# Patient Record
Sex: Female | Born: 1970 | Hispanic: Yes | Smoking: Never smoker
Health system: Southern US, Community
[De-identification: ages and names within clinical notes are randomized; demographics above are authoritative.]

## PROBLEM LIST (undated history)

## (undated) HISTORY — PX: BREAST BIOPSY: SHX20

---

## 2008-12-25 ENCOUNTER — Other Ambulatory Visit: Admission: RE | Admit: 2008-12-25 | Discharge: 2008-12-25 | Payer: Self-pay | Admitting: Family Medicine

## 2011-06-30 ENCOUNTER — Other Ambulatory Visit (HOSPITAL_COMMUNITY)
Admission: RE | Admit: 2011-06-30 | Discharge: 2011-06-30 | Disposition: A | Discharge status: Home / Self Care | Payer: BC Managed Care – PPO | Source: Ambulatory Visit | Attending: Family Medicine | Admitting: Family Medicine

## 2011-06-30 DIAGNOSIS — Z124 Encounter for screening for malignant neoplasm of cervix: Secondary | ICD-10-CM | POA: Insufficient documentation

## 2011-06-30 DIAGNOSIS — Z1159 Encounter for screening for other viral diseases: Secondary | ICD-10-CM | POA: Insufficient documentation

## 2013-12-06 ENCOUNTER — Other Ambulatory Visit: Payer: Self-pay

## 2013-12-06 DIAGNOSIS — Z1231 Encounter for screening mammogram for malignant neoplasm of breast: Secondary | ICD-10-CM

## 2013-12-26 ENCOUNTER — Ambulatory Visit
Admission: RE | Admit: 2013-12-26 | Discharge: 2013-12-26 | Disposition: A | Discharge status: Home / Self Care | Payer: No Typology Code available for payment source | Source: Ambulatory Visit

## 2013-12-26 DIAGNOSIS — Z1231 Encounter for screening mammogram for malignant neoplasm of breast: Secondary | ICD-10-CM

## 2014-12-12 ENCOUNTER — Other Ambulatory Visit: Payer: Self-pay

## 2014-12-12 DIAGNOSIS — Z1231 Encounter for screening mammogram for malignant neoplasm of breast: Secondary | ICD-10-CM

## 2015-01-16 ENCOUNTER — Other Ambulatory Visit: Payer: Self-pay | Admitting: Family Medicine

## 2015-01-16 ENCOUNTER — Other Ambulatory Visit (HOSPITAL_COMMUNITY)
Admission: RE | Admit: 2015-01-16 | Discharge: 2015-01-16 | Disposition: A | Discharge status: Home / Self Care | Payer: No Typology Code available for payment source | Source: Ambulatory Visit | Attending: Family Medicine | Admitting: Family Medicine

## 2015-01-16 DIAGNOSIS — Z01419 Encounter for gynecological examination (general) (routine) without abnormal findings: Secondary | ICD-10-CM | POA: Insufficient documentation

## 2015-01-17 LAB — CYTOLOGY - PAP

## 2015-01-18 ENCOUNTER — Ambulatory Visit
Admission: RE | Admit: 2015-01-18 | Discharge: 2015-01-18 | Disposition: A | Discharge status: Home / Self Care | Payer: No Typology Code available for payment source | Source: Ambulatory Visit

## 2015-01-18 DIAGNOSIS — Z1231 Encounter for screening mammogram for malignant neoplasm of breast: Secondary | ICD-10-CM

## 2015-03-08 ENCOUNTER — Emergency Department (INDEPENDENT_AMBULATORY_CARE_PROVIDER_SITE_OTHER)
Admission: EM | Admit: 2015-03-08 | Discharge: 2015-03-08 | Disposition: A | Discharge status: Home / Self Care | Payer: 59 | Source: Home / Self Care | Attending: Family Medicine | Admitting: Family Medicine

## 2015-03-08 ENCOUNTER — Encounter (HOSPITAL_COMMUNITY): Payer: Self-pay

## 2015-03-08 DIAGNOSIS — J069 Acute upper respiratory infection, unspecified: Secondary | ICD-10-CM | POA: Diagnosis not present

## 2015-03-08 MED ORDER — IPRATROPIUM BROMIDE 0.06 % NA SOLN: 2.0000 | Freq: Four times a day (QID) | NASAL | Status: DC

## 2015-03-08 NOTE — ED Provider Notes (Signed)
CSN: [id number]     Arrival date & time 03/08/15  1826 History   First MD Initiated Contact with Patient 03/08/15 1841     Chief Complaint  Patient presents with  . Cough   (Consider location/radiation/quality/duration/timing/severity/associated sxs/prior Treatment) Patient is a 44 y.o. female presenting with cough. The history is provided by the patient.  Cough Cough characteristics:  Dry, non-productive and harsh Severity:  Mild Onset quality:  Gradual Duration:  10 days Chronicity:  New Smoker: no   Context: sick contacts and upper respiratory infection   Ineffective treatments:  Steam and fluids Associated symptoms: rhinorrhea and sinus congestion   Associated symptoms: no chest pain, no chills, no fever, no sore throat and no wheezing     History reviewed. No pertinent past medical history. History reviewed. No pertinent past surgical history. No family history on file. Social History  Substance Use Topics  . Smoking status: None  . Smokeless tobacco: None  . Alcohol Use: None   OB History    No data available     Review of Systems  Constitutional: Negative.  Negative for fever and chills.  HENT: Positive for congestion, postnasal drip and rhinorrhea. Negative for sore throat.   Respiratory: Positive for cough. Negative for wheezing.   Cardiovascular: Negative.  Negative for chest pain.  All other systems reviewed and are negative.   Allergies  Review of patient's allergies indicates no known allergies.  Home Medications   Prior to Admission medications   Medication Sig Start Date End Date Taking? Authorizing Provider  ipratropium (ATROVENT) 0.06 % nasal spray Place 2 sprays into both nostrils 4 (four) times daily. 03/08/15   Linna HoffJames D Aydeen Blume, MD   Meds Ordered and Administered this Visit  Medications - No data to display  BP 129/77 mmHg  Pulse 76  Temp(Src) 97.4 F (36.3 C) (Oral)  Resp 17  SpO2 99%  LMP 02/26/2015 No data found.   Physical Exam   Constitutional: She is oriented to person, place, and time. She appears well-developed and well-nourished. No distress.  HENT:  Head: Normocephalic.  Right Ear: External ear normal.  Left Ear: External ear normal.  Nose: Mucosal edema and rhinorrhea present.  Mouth/Throat: Oropharynx is clear and moist.  Neck: Normal range of motion. Neck supple.  Cardiovascular: Normal heart sounds.   Pulmonary/Chest: Effort normal and breath sounds normal.  Lymphadenopathy:    She has no cervical adenopathy.  Neurological: She is alert and oriented to person, place, and time.  Skin: Skin is warm and dry.  Nursing note and vitals reviewed.   ED Course  Procedures (including critical care time)  Labs Review Labs Reviewed - No data to display  Imaging Review No results found.   Visual Acuity Review  Right Eye Distance:   Left Eye Distance:   Bilateral Distance:    Right Eye Near:   Left Eye Near:    Bilateral Near:         MDM   1. URI (upper respiratory infection)        Linna HoffJames D Ally Knodel, MD 03/08/15 [phone]

## 2015-03-08 NOTE — ED Notes (Signed)
Patient complains of sinus pressure. And cough that started about a week ago Patient stated she was seen in a mini clinic and told she had bronchitis and was given an  Inhaler and some cough medication but is still not any better

## 2015-03-08 NOTE — Discharge Instructions (Signed)
Drink plenty of fluids as discussed, use medicine as prescribed, and mucinex or delsym for cough. Return or see your doctor if further problems °

## 2016-09-10 ENCOUNTER — Other Ambulatory Visit: Payer: Self-pay | Admitting: Family Medicine

## 2016-09-10 DIAGNOSIS — Z1231 Encounter for screening mammogram for malignant neoplasm of breast: Secondary | ICD-10-CM

## 2016-09-18 ENCOUNTER — Ambulatory Visit
Admission: RE | Admit: 2016-09-18 | Discharge: 2016-09-18 | Disposition: A | Discharge status: Home / Self Care | Payer: BLUE CROSS/BLUE SHIELD | Source: Ambulatory Visit | Attending: Family Medicine | Admitting: Family Medicine

## 2016-09-18 DIAGNOSIS — Z1231 Encounter for screening mammogram for malignant neoplasm of breast: Secondary | ICD-10-CM

## 2016-09-19 ENCOUNTER — Other Ambulatory Visit: Payer: Self-pay | Admitting: Family Medicine

## 2016-09-19 DIAGNOSIS — R928 Other abnormal and inconclusive findings on diagnostic imaging of breast: Secondary | ICD-10-CM

## 2016-09-25 ENCOUNTER — Ambulatory Visit
Admission: RE | Admit: 2016-09-25 | Discharge: 2016-09-25 | Disposition: A | Discharge status: Home / Self Care | Payer: BLUE CROSS/BLUE SHIELD | Source: Ambulatory Visit | Attending: Family Medicine | Admitting: Family Medicine

## 2016-09-25 DIAGNOSIS — R928 Other abnormal and inconclusive findings on diagnostic imaging of breast: Secondary | ICD-10-CM

## 2017-09-28 ENCOUNTER — Other Ambulatory Visit: Payer: Self-pay | Admitting: Family Medicine

## 2017-09-28 DIAGNOSIS — Z1231 Encounter for screening mammogram for malignant neoplasm of breast: Secondary | ICD-10-CM

## 2017-09-29 ENCOUNTER — Ambulatory Visit
Admission: RE | Admit: 2017-09-29 | Discharge: 2017-09-29 | Disposition: A | Discharge status: Home / Self Care | Payer: BC Managed Care – PPO | Source: Ambulatory Visit | Attending: Family Medicine | Admitting: Family Medicine

## 2017-09-29 DIAGNOSIS — Z1231 Encounter for screening mammogram for malignant neoplasm of breast: Secondary | ICD-10-CM

## 2017-10-08 ENCOUNTER — Other Ambulatory Visit (HOSPITAL_COMMUNITY)
Admission: RE | Admit: 2017-10-08 | Discharge: 2017-10-08 | Disposition: A | Discharge status: Home / Self Care | Payer: BC Managed Care – PPO | Source: Ambulatory Visit | Attending: Family Medicine | Admitting: Family Medicine

## 2017-10-08 ENCOUNTER — Other Ambulatory Visit: Payer: Self-pay | Admitting: Family Medicine

## 2017-10-08 DIAGNOSIS — Z01411 Encounter for gynecological examination (general) (routine) with abnormal findings: Secondary | ICD-10-CM | POA: Insufficient documentation

## 2017-10-09 LAB — CYTOLOGY - PAP: DIAGNOSIS: NEGATIVE

## 2018-09-17 ENCOUNTER — Other Ambulatory Visit: Payer: Self-pay | Admitting: Family Medicine

## 2018-09-17 DIAGNOSIS — Z1231 Encounter for screening mammogram for malignant neoplasm of breast: Secondary | ICD-10-CM

## 2018-10-19 ENCOUNTER — Other Ambulatory Visit: Payer: Self-pay

## 2018-10-19 ENCOUNTER — Ambulatory Visit
Admission: RE | Admit: 2018-10-19 | Discharge: 2018-10-19 | Disposition: A | Discharge status: Home / Self Care | Payer: BC Managed Care – PPO | Source: Ambulatory Visit | Attending: Family Medicine | Admitting: Family Medicine

## 2018-10-19 DIAGNOSIS — Z1231 Encounter for screening mammogram for malignant neoplasm of breast: Secondary | ICD-10-CM

## 2018-10-20 ENCOUNTER — Other Ambulatory Visit: Payer: Self-pay | Admitting: Family Medicine

## 2018-10-20 DIAGNOSIS — Z1231 Encounter for screening mammogram for malignant neoplasm of breast: Secondary | ICD-10-CM

## 2018-12-01 ENCOUNTER — Encounter: Payer: Self-pay | Admitting: Family Medicine

## 2018-12-01 ENCOUNTER — Ambulatory Visit: Payer: Self-pay

## 2018-12-01 ENCOUNTER — Ambulatory Visit: Payer: BC Managed Care – PPO | Admitting: Family Medicine

## 2018-12-01 ENCOUNTER — Other Ambulatory Visit: Payer: Self-pay

## 2018-12-01 VITALS — BP 124/70 | Ht 62.0 in | Wt 135.0 lb

## 2018-12-01 DIAGNOSIS — M25521 Pain in right elbow: Secondary | ICD-10-CM

## 2018-12-01 DIAGNOSIS — M7711 Lateral epicondylitis, right elbow: Secondary | ICD-10-CM

## 2018-12-01 NOTE — Patient Instructions (Signed)
Your elbow pain is caused by lateral epicondylitis (tennis elbow) -We will give you a brace to wear.  You should use this during activities such as tennis - Do the exercises shown to you at today's visit most days of the week for the next 4 to 6 weeks - If your pain does not improve in the next 4 to 6 weeks you can come back in for follow-up

## 2018-12-01 NOTE — Progress Notes (Signed)
PCP: London Pepper, MD  Subjective:   HPI: Patient is a 48 y.o. female here for evaluation of right elbow pain.  The pain started approximately 6 months ago.  She denies any injury or trauma but she does play tennis and notes the pain gets aggravated by this.  Initially the pain is located on lateral aspect of her elbow but is now radiating down to her forearm.  She denies any associated numbness or tingling.  She has no bruising or swelling.  Patient notes the pain at the lateral aspect of her elbow as tolerated but since it has started radiating down to her forearm she decided come to see a physician for it.  She currently rates the pain 0 out of 10.  Review of Systems: See HPI above.  History reviewed. No pertinent past medical history.  No current outpatient medications on file prior to visit.   No current facility-administered medications on file prior to visit.     History reviewed. No pertinent surgical history.  No Known Allergies  Social History   Socioeconomic History  . Marital status: Unknown    Spouse name: Not on file  . Number of children: Not on file  . Years of education: Not on file  . Highest education level: Not on file  Occupational History  . Not on file  Social Needs  . Financial resource strain: Not on file  . Food insecurity    Worry: Not on file    Inability: Not on file  . Transportation needs    Medical: Not on file    Non-medical: Not on file  Tobacco Use  . Smoking status: Not on file  Substance and Sexual Activity  . Alcohol use: Not on file  . Drug use: Not on file  . Sexual activity: Not on file  Lifestyle  . Physical activity    Days per week: Not on file    Minutes per session: Not on file  . Stress: Not on file  Relationships  . Social Herbalist on phone: Not on file    Gets together: Not on file    Attends religious service: Not on file    Active member of club or organization: Not on file    Attends meetings of  clubs or organizations: Not on file    Relationship status: Not on file  . Intimate partner violence    Fear of current or ex partner: Not on file    Emotionally abused: Not on file    Physically abused: Not on file    Forced sexual activity: Not on file  Other Topics Concern  . Not on file  Social History Narrative  . Not on file    History reviewed. No pertinent family history.      Objective:  Physical Exam: BP 124/70   Ht 5\' 2"  (1.575 m)   Wt 135 lb (61.2 kg)   BMI 24.69 kg/m  Gen: NAD, comfortable in exam room Lungs: Breathing comfortably on room air Elbow Exam Right -Inspection: No discoloration, no deformity -Palpation: Minimal tenderness at common extensor origin. -ROM: Normal ROM with flexion, extension, pronation, supination -Strength: 5/5 strength with flexion, extension, pronation, supination.  Minimal pain with resisted wrist extension -Valgus stress: Negative -Limb neurovascularly intact  Contralateral Elbow -Inspection: No discoloration, no deformity -Palpation: No tenderness to palpation -ROM: Normal ROM with flexion, extension, pronation, supination -Strength: 5/5 strength with flexion, extension, pronation, supination -Limb neurovascularly intact  Limited diagnostic ultrasound  of right elbow Findings: - Intact common extensor tendon -Normal-appearing radial collateral ligament Impression: - Normal-appearing ultrasound of the lateral elbow    Assessment & Plan:  Patient is a 48 y.o. female here for evaluation of right elbow pain  1.  Right lateral epicondylitis - Patient given brace to wear with activities such as tennis - Patient given home exercises to work on -Patient may take Tylenol or ibuprofen as needed for pain  Follow-up in 4 to 6 weeks as needed

## 2018-12-02 ENCOUNTER — Encounter: Payer: Self-pay | Admitting: Family Medicine

## 2019-01-12 ENCOUNTER — Ambulatory Visit: Payer: BC Managed Care – PPO | Admitting: Sports Medicine

## 2019-10-31 ENCOUNTER — Other Ambulatory Visit: Payer: Self-pay

## 2019-10-31 ENCOUNTER — Ambulatory Visit
Admission: RE | Admit: 2019-10-31 | Discharge: 2019-10-31 | Disposition: A | Discharge status: Home / Self Care | Payer: BC Managed Care – PPO | Source: Ambulatory Visit | Attending: Family Medicine | Admitting: Family Medicine

## 2019-10-31 DIAGNOSIS — Z1231 Encounter for screening mammogram for malignant neoplasm of breast: Secondary | ICD-10-CM

## 2020-09-26 ENCOUNTER — Other Ambulatory Visit: Payer: Self-pay | Admitting: Family Medicine

## 2020-09-26 DIAGNOSIS — Z1231 Encounter for screening mammogram for malignant neoplasm of breast: Secondary | ICD-10-CM

## 2020-11-12 ENCOUNTER — Other Ambulatory Visit: Payer: Self-pay

## 2020-11-12 ENCOUNTER — Ambulatory Visit
Admission: RE | Admit: 2020-11-12 | Discharge: 2020-11-12 | Disposition: A | Discharge status: Home / Self Care | Payer: Self-pay | Source: Ambulatory Visit | Attending: Family Medicine | Admitting: Family Medicine

## 2020-11-12 DIAGNOSIS — Z1231 Encounter for screening mammogram for malignant neoplasm of breast: Secondary | ICD-10-CM

## 2021-02-05 ENCOUNTER — Other Ambulatory Visit (HOSPITAL_COMMUNITY)
Admission: RE | Admit: 2021-02-05 | Discharge: 2021-02-05 | Disposition: A | Discharge status: Home / Self Care | Payer: BC Managed Care – PPO | Source: Ambulatory Visit | Attending: Family Medicine | Admitting: Family Medicine

## 2021-02-05 DIAGNOSIS — Z01411 Encounter for gynecological examination (general) (routine) with abnormal findings: Secondary | ICD-10-CM | POA: Insufficient documentation

## 2021-02-07 LAB — CYTOLOGY - PAP
Comment: NEGATIVE
Diagnosis: NEGATIVE
High risk HPV: NEGATIVE

## 2021-07-31 ENCOUNTER — Ambulatory Visit
Admission: RE | Admit: 2021-07-31 | Discharge: 2021-07-31 | Disposition: A | Discharge status: Home / Self Care | Payer: BC Managed Care – PPO | Source: Ambulatory Visit | Attending: Family Medicine | Admitting: Family Medicine

## 2021-07-31 ENCOUNTER — Other Ambulatory Visit: Payer: Self-pay | Admitting: Family Medicine

## 2021-07-31 DIAGNOSIS — R0789 Other chest pain: Secondary | ICD-10-CM

## 2021-08-01 ENCOUNTER — Telehealth: Payer: Self-pay

## 2021-08-01 NOTE — Telephone Encounter (Signed)
NOTES SCANNED TO REFERRAL 

## 2021-08-08 ENCOUNTER — Encounter: Payer: Self-pay | Admitting: Cardiovascular Disease

## 2021-08-08 ENCOUNTER — Ambulatory Visit (INDEPENDENT_AMBULATORY_CARE_PROVIDER_SITE_OTHER): Payer: BC Managed Care – PPO | Admitting: Cardiovascular Disease

## 2021-08-08 VITALS — BP 120/70 | HR 71 | Ht 62.0 in | Wt 138.0 lb

## 2021-08-08 DIAGNOSIS — R072 Precordial pain: Secondary | ICD-10-CM

## 2021-08-08 DIAGNOSIS — E785 Hyperlipidemia, unspecified: Secondary | ICD-10-CM | POA: Insufficient documentation

## 2021-08-08 MED ORDER — METOPROLOL TARTRATE 50 MG PO TABS: ORAL_TABLET | ORAL | 0 refills | Status: AC

## 2021-08-08 NOTE — Patient Instructions (Signed)
Medication Instructions:  ?No changes ?*If you need a refill on your cardiac medications before your next appointment, please call your pharmacy* ? ? ?Lab Work: ?Labs completed 5/3 at PCP  ?If you have labs (blood work) drawn today and your tests are completely normal, you will receive your results only by: ?MyChart Message (if you have MyChart) OR ?A paper copy in the mail ?If you have any lab test that is abnormal or we need to change your treatment, we will call you to review the results. ? ? ?Follow-Up: ?At [facility], you and your health needs are our priority.  As part of our continuing mission to provide you with exceptional heart care, we have created designated Provider Care Teams.  These Care Teams include your primary Cardiologist (physician) and Advanced Practice Providers (APPs -  Physician Assistants and Nurse Practitioners) who all work together to provide you with the care you need, when you need it. ? ?We recommend signing up for the patient portal called "MyChart".  Sign up information is provided on this After Visit Summary.  MyChart is used to connect with patients for Virtual Visits (Telemedicine).  Patients are able to view lab/test results, encounter notes, upcoming appointments, etc.  Non-urgent messages can be sent to your provider as well.   ?To learn more about what you can do with MyChart, go to [email or web address].   ? ?Your next appointment:   ?Follow up as needed with Dr. Royann Shivers ? ? ?Other Instructions ? ? ?Your cardiac CT will be scheduled at one of the below locations:  ? ?[facility] ?[address] ?[place], [place] [address] ?[phone] ? ?OR ? ?[facility] ?[address] ?[address] ?[place], [place] [address] ?[phone] ? ?If scheduled at [facility], please arrive at the [facility] and Children's Entrance (Entrance C2) of [facility] 30 minutes prior to test start time. ?You can use the FREE valet  parking offered at entrance C (encouraged to control the heart rate for the test)  ?Proceed to the [facility] Radiology Department (first floor) to check-in and test prep. ? ?All radiology patients and guests should use entrance C2 at [facility], accessed from [facility], even though the hospital's physical address listed is [address]. ? ? ? ?If scheduled at [facility], please arrive 15 mins early for check-in and test prep. ? ?Please follow these instructions carefully (unless otherwise directed): ? ?On the Night Before the Test: ?Be sure to Drink plenty of water. ?Do not consume any caffeinated/decaffeinated beverages or chocolate 12 hours prior to your test. ?Do not take any antihistamines 12 hours prior to your test. ? ?On the Day of the Test: ?Drink plenty of water until 1 hour prior to the test. ?Do not eat any food 4 hours prior to the test. ?You may take your regular medications prior to the test.  ?Take metoprolol (Lopressor) two hours prior to test. ?FEMALES- please wear underwire-free bra if available, avoid dresses & tight clothing ?     ?After the Test: ?Drink plenty of water. ?After receiving IV contrast, you may experience a mild flushed feeling. This is normal. ?On occasion, you may experience a mild rash up to 24 hours after the test. This is not dangerous. If this occurs, you can take Benadryl 25 mg and increase your fluid intake. ?If you experience trouble breathing, this can be serious. If it is severe call 911 IMMEDIATELY. If it  is mild, please call our office. ?If you take any of these medications: Glipizide/Metformin, Avandament, Glucavance, please do not take 48 hours after completing test unless otherwise instructed. ? ?We will call to schedule your test 2-4 weeks out understanding that some insurance companies will need an authorization prior to the service being performed.  ? ?For non-scheduling related questions, please contact  the cardiac imaging nurse navigator should you have any questions/concerns: ?Rockwell Alexandria, Cardiac Imaging Nurse Navigator ?Larey Brick, Cardiac Imaging Nurse Navigator ? Heart and Vascular Services ?Direct Office Dial: [phone]  ? ?For scheduling needs, including cancellations and rescheduling, please call [place], [phone]. ? ? ? ? ?

## 2021-08-08 NOTE — Progress Notes (Signed)
?Cardiology Office Note:   ? ?Date:  08/08/2021  ? ?ID:  Janice Rich, DOB [birth year], MRN [id number] ? ?PCP:  Farris Has, MD ?  ?[facility] Providers ?Cardiologist:  None    ? ?Referring MD: Farris Has, MD  ? ?Chief Complaint  ?Patient presents with  ? Consult  ?Janice Rich is a 51 y.o. female who is being seen today for the evaluation of chest tightness at the request of Farris Has, MD. ? ?History of Present Illness:   ? ?Janice Rich is a 51 y.o. female with a hx of mild hypercholesterolemia, otherwise excellent health, who has recently developed chest pressure.  This usually occurs with activity.  She initially noticed that when she was walking the dog.  It improved after resting for a few minutes.  Another time she noticed that while she was dancing during labs in celebration and [address] , again resolving with activity.  She has had 1 or 2 events where she was awoke in the middle of the night with chest tightness.  This has been less frequent.  Each time her symptoms usually resolve within 2 or 3 minutes.  She is currently asymptomatic. ? ?Does not have exertional dyspnea, orthopnea, PND or lower extremity edema.  Denies claudication or focal neurological complaints.  Has a few musculoskeletal joint issues.  Denies palpitations, dizziness or syncope.  Enjoys playing tennis, but has avoided this since the onset of her chest tightness. ? ?She has never smoked.  She does not have hypertension or diabetes mellitus.  She went through menopause still symptoms about 2 or 3 years ago.  He is unable to report family history since she was adopted.  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  Patient ? ?History reviewed. No pertinent past medical history. ? ?History reviewed. No pertinent surgical history. ? ?Current Medications: ?Current Meds  ?Medication Sig  ? cetirizine (ZYRTEC) 10 MG tablet  Take 10 mg by mouth daily in the afternoon.  ? fluticasone (FLONASE) 50 MCG/ACT nasal spray Place into both nostrils daily in the afternoon.  ? metoprolol tartrate (LOPRESSOR) 50 MG tablet Take one tablet 2 hours before the test  ? naproxen sodium (ALEVE) 220 MG tablet 1 tablet as needed  ?  ? ?Allergies:   Patient has no known allergies.  ? ?Social History  ? ?Socioeconomic History  ? Marital status: Unknown  ?  Spouse name: Not on file  ? Number of children: Not on file  ? Years of education: Not on file  ? Highest education level: Not on file  ?Occupational History  ? Not on file  ?Tobacco Use  ? Smoking status: Never  ?  Passive exposure: Past  ? Smokeless tobacco: Never  ?Substance and Sexual Activity  ? Alcohol use: Not on file  ? Drug use: Not on file  ? Sexual activity: Not on file  ?Other Topics Concern  ? Not on file  ?Social History Narrative  ? Not on file  ? ?Social Determinants of Health  ? ?Financial Resource Strain: Not on file  ?Food Insecurity: Not on file  ?Transportation Needs: Not on file  ?Physical Activity: Not on file  ?Stress: Not on file  ?Social Connections: Not on file  ?  ? ?Family History: ?The patient's family history is unknown since she is adopted ? ?ROS:   ?Please see the history of present illness.    ?  All other systems reviewed and are negative. ? ?EKGs/Labs/Other Studies Reviewed:   ? ?The following studies were reviewed today: ?Notes from primary care provider. ? ?EKG:  EKG is ordered today.  The ekg ordered today demonstrates normal sinus rhythm, normal tracing.  QTc 417 ms ? ?Recent Labs: ?No results found for requested labs within last 8760 hours.  ?Recent Lipid Panel ?No results found for: CHOL, TRIG, HDL, CHOLHDL, VLDL, LDLCALC, LDLDIRECT ?02/05/2021 ?Cholesterol 218, HDL 71, LDL 135, triglycerides 69 ?07/31/2021 ?Hemoglobin 12.5, creatinine 0.83, potassium 4.6, ALT 20, TSH 1.53 ? ?Risk Assessment/Calculations:   ?  ? ?    ? ?Physical Exam:   ? ?VS:  BP 120/70 (BP  Location: Left Arm, Patient Position: Sitting, Cuff Size: Normal)   Pulse 71   Ht 5\' 2"  (1.575 m)   Wt 138 lb (62.6 kg)   SpO2 98%   BMI 25.24 kg/m?    ? ?Wt Readings from Last 3 Encounters:  ?08/08/21 138 lb (62.6 kg)  ?12/01/18 135 lb (61.2 kg)  ?  ? ?GEN: Appears lean and fit, well nourished, well developed in no acute distress ?HEENT: Normal ?NECK: No JVD; No carotid bruits ?LYMPHATICS: No lymphadenopathy ?CARDIAC: RRR, no murmurs, rubs, gallops ?RESPIRATORY:  Clear to auscultation without rales, wheezing or rhonchi  ?ABDOMEN: Soft, non-tender, non-distended ?MUSCULOSKELETAL:  No edema; No deformity  ?SKIN: Warm and dry ?NEUROLOGIC:  Alert and oriented x 3 ?PSYCHIATRIC:  Normal affect  ? ?ASSESSMENT:   ? ?1. Precordial pain   ? ?PLAN:   ? ?In order of problems listed above: ? ?Chest discomfort: Symptoms are mostly exertional and compatible with stable angina pectoris.  Suggested that she take aspirin 81 mg daily until clarify the diagnosis.  We will get a coronary CT angiogram.  Discussed the difference in stable and unstable angina symptoms and when she should seek urgent attention. ?HLP: Her LDL cholesterol is mildly elevated, but she also has an excellent HDL cholesterol.  Coronary calcium score will help 01/31/19 decide whether she needs more aggressive lipid-lowering. ? ?   ? ?   ? ? ?Medication Adjustments/Labs and Tests Ordered: ?Current medicines are reviewed at length with the patient today.  Concerns regarding medicines are outlined above.  ?Orders Placed This Encounter  ?Procedures  ? CT CORONARY MORPH W/CTA COR W/SCORE W/CA W/CM &/OR WO/CM  ? EKG 12-Lead  ? ?Meds ordered this encounter  ?Medications  ? metoprolol tartrate (LOPRESSOR) 50 MG tablet  ?  Sig: Take one tablet 2 hours before the test  ?  Dispense:  1 tablet  ?  Refill:  0  ? ? ?Patient Instructions  ?Medication Instructions:  ?No changes ?*If you need a refill on your cardiac medications before your next appointment, please call your  pharmacy* ? ? ?Lab Work: ?Labs completed 5/3 at PCP  ?If you have labs (blood work) drawn today and your tests are completely normal, you will receive your results only by: ?MyChart Message (if you have MyChart) OR ?A paper copy in the mail ?If you have any lab test that is abnormal or we need to change your treatment, we will call you to review the results. ? ? ?Follow-Up: ?At [facility], you and your health needs are our priority.  As part of our continuing mission to provide you with exceptional heart care, we have created designated Provider Care Teams.  These Care Teams include your primary Cardiologist (physician) and Advanced Practice Providers (APPs -  Physician Assistants and Nurse Practitioners) who  all work together to provide you with the care you need, when you need it. ? ?We recommend signing up for the patient portal called "MyChart".  Sign up information is provided on this After Visit Summary.  MyChart is used to connect with patients for Virtual Visits (Telemedicine).  Patients are able to view lab/test results, encounter notes, upcoming appointments, etc.  Non-urgent messages can be sent to your provider as well.   ?To learn more about what you can do with MyChart, go to [email or web address].   ? ?Your next appointment:   ?Follow up as needed with Dr. Royann Shiversroitoru ? ? ?Other Instructions ? ? ?Your cardiac CT will be scheduled at one of the below locations:  ? ?[facility] ?[address] ?[place], [place] [address] ?[phone] ? ?OR ? ?[facility] ?[address] ?[address] ?[place], [place] [address] ?[phone] ? ?If scheduled at [facility], please arrive at the [facility] (Entrance C2) of [facility] 30 minutes prior to test start time. ?You can use the FREE valet parking offered at entrance C (encouraged to control the heart rate for the test)  ?Proceed to the [facility] Radiology Department (first  floor) to check-in and test prep. ? ?All radiology patients and guests should use entrance C2 at [facility], accessed from [facility], even though the hospital's physical address listed is [address]

## 2021-09-04 ENCOUNTER — Telehealth (HOSPITAL_COMMUNITY): Payer: Self-pay | Admitting: *Deleted

## 2021-09-04 NOTE — Telephone Encounter (Signed)
Attempted to call patient regarding upcoming cardiac CT appointment. °Left message on voicemail with name and callback number ° °Aya Geisel RN Navigator Cardiac Imaging °[facility] °[phone] Office °[phone] Cell ° °

## 2021-09-05 ENCOUNTER — Ambulatory Visit (HOSPITAL_COMMUNITY)
Admission: RE | Admit: 2021-09-05 | Discharge: 2021-09-05 | Disposition: A | Discharge status: Home / Self Care | Payer: BC Managed Care – PPO | Source: Ambulatory Visit | Attending: Cardiovascular Disease | Admitting: Cardiovascular Disease

## 2021-09-05 DIAGNOSIS — R072 Precordial pain: Secondary | ICD-10-CM | POA: Insufficient documentation

## 2021-09-05 MED ORDER — IOHEXOL 350 MG/ML SOLN
100.0000 mL | Freq: Once | INTRAVENOUS | Status: AC | PRN
  Administered 2021-09-05: 100 mL via INTRAVENOUS

## 2021-09-05 MED ORDER — NITROGLYCERIN 0.4 MG SL SUBL
0.8000 mg | SUBLINGUAL_TABLET | Freq: Once | SUBLINGUAL | Status: AC
  Administered 2021-09-05: 0.8 mg via SUBLINGUAL

## 2021-09-05 MED ORDER — NITROGLYCERIN 0.4 MG SL SUBL
SUBLINGUAL_TABLET | SUBLINGUAL | Status: AC
  Filled 2021-09-05: qty 2

## 2022-02-03 IMAGING — MG MM DIGITAL SCREENING BILAT W/ TOMO AND CAD
8 series · 9 of 24 positions shown · non-contrast
Comparison: Previous exam(s).

CLINICAL DATA: Screening.

EXAM:
DIGITAL SCREENING BILATERAL MAMMOGRAM WITH TOMOSYNTHESIS AND CAD
TECHNIQUE: Bilateral screening digital craniocaudal and mediolateral oblique
mammograms were obtained. Bilateral screening digital breast
tomosynthesis was performed. The images were evaluated with
computer-aided detection.

[R MLO synth-2D]
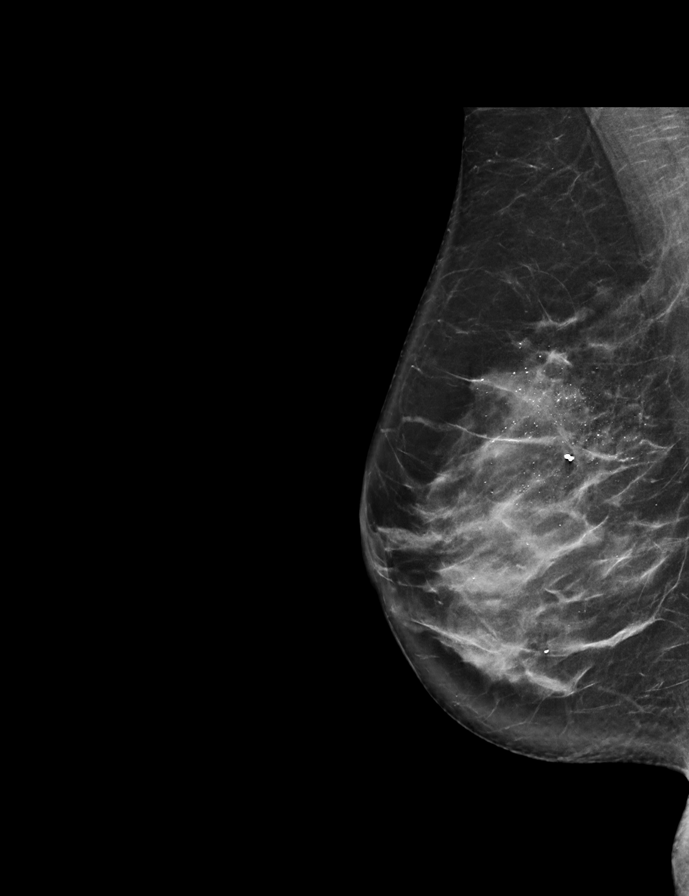

[L MLO synth-2D]
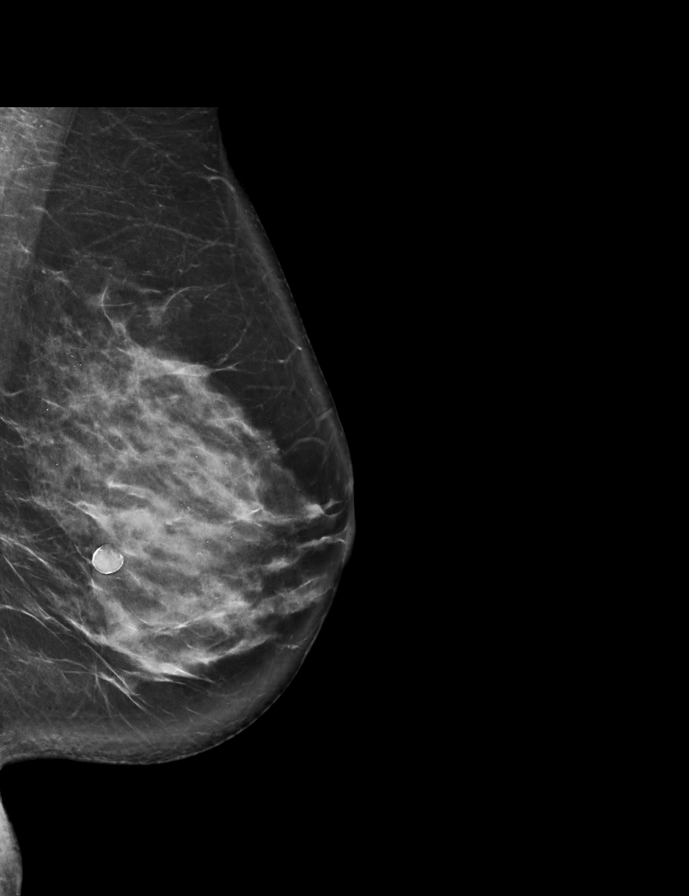

[R CC synth-2D]
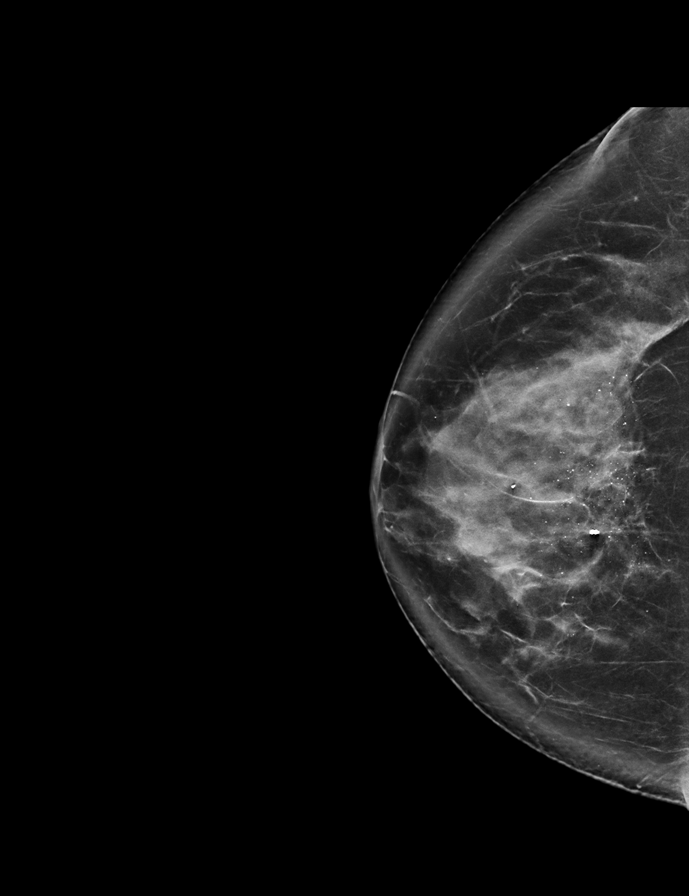

[L CC synth-2D]
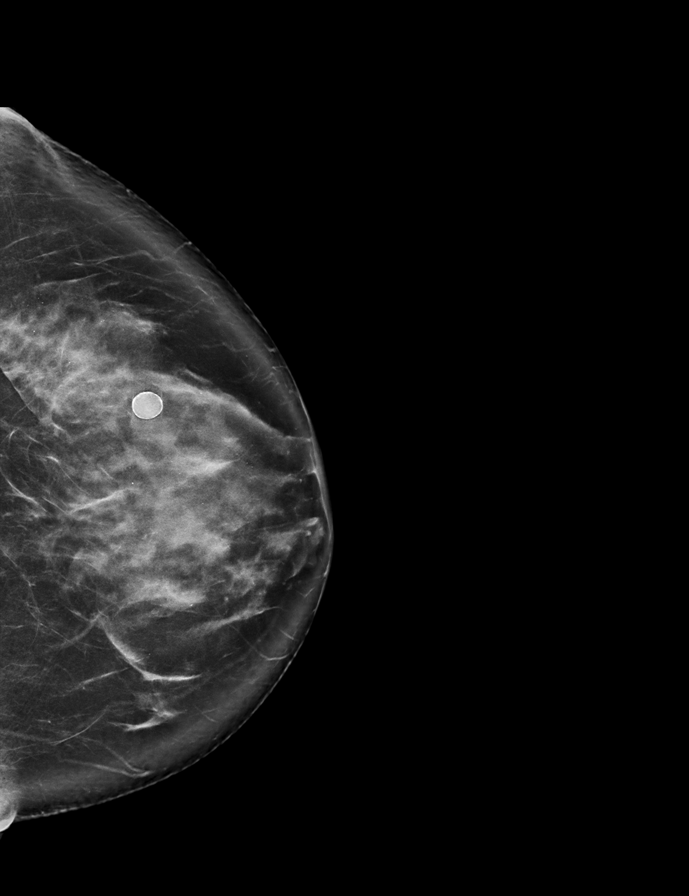

[R MLO tomo · 2 of 82 frames shown]
[frame 27/82]
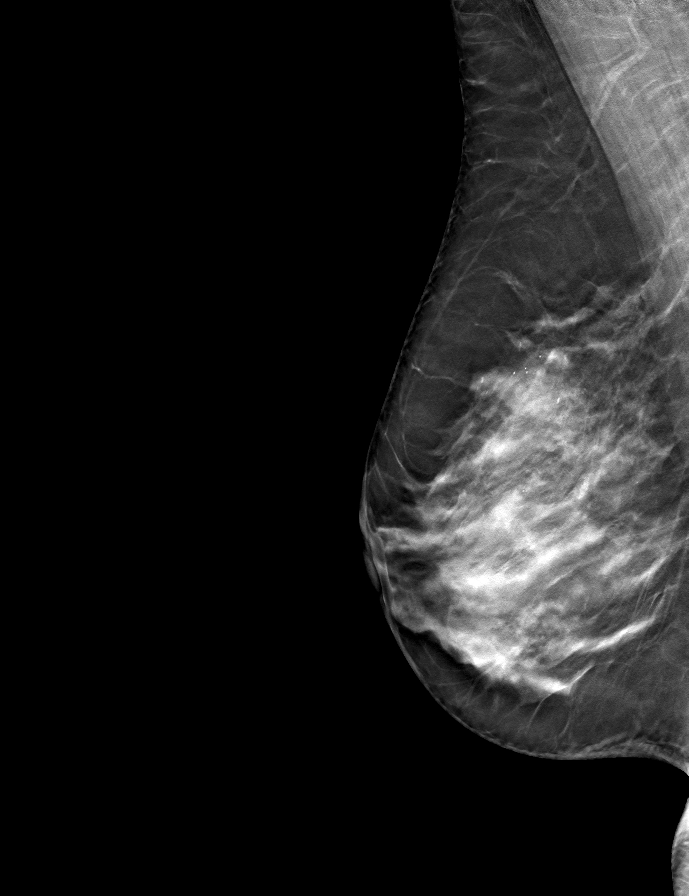
[frame 41/82]
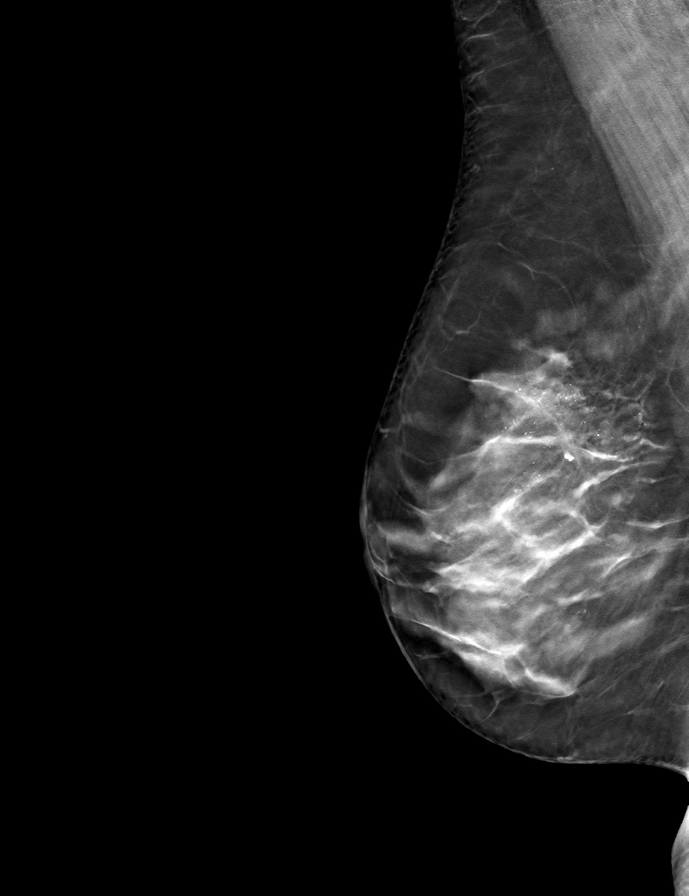

[R CC tomo · tomo slice 44/87.0]
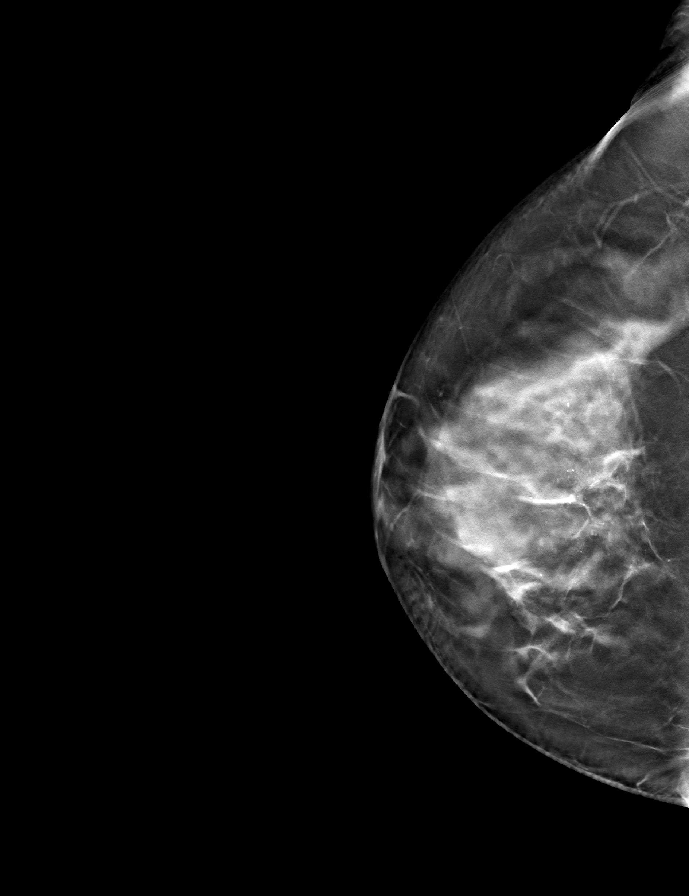

[L CC tomo · tomo slice 46/91.0]
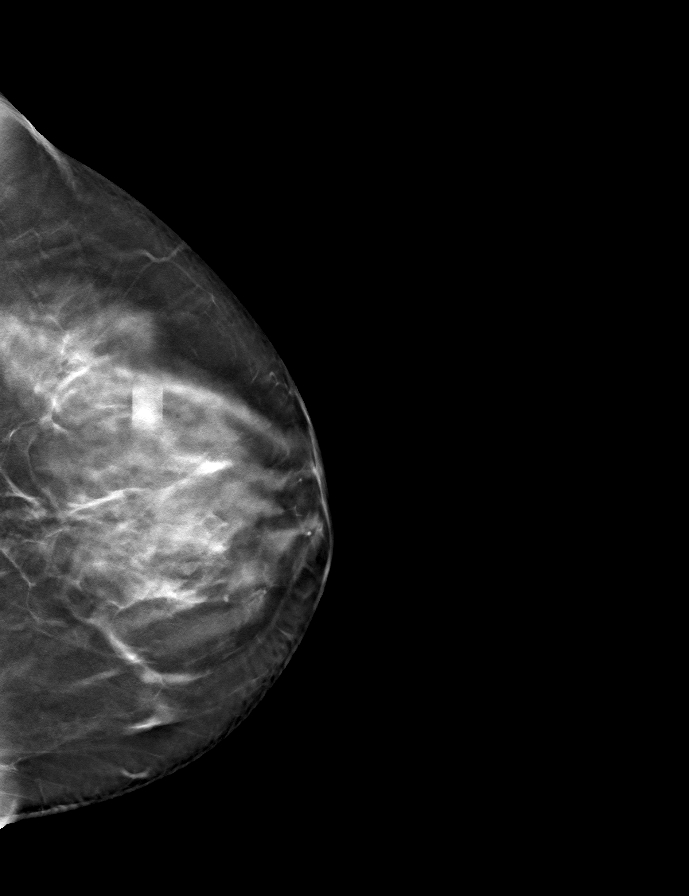

[L MLO tomo · tomo slice 45/89.0]
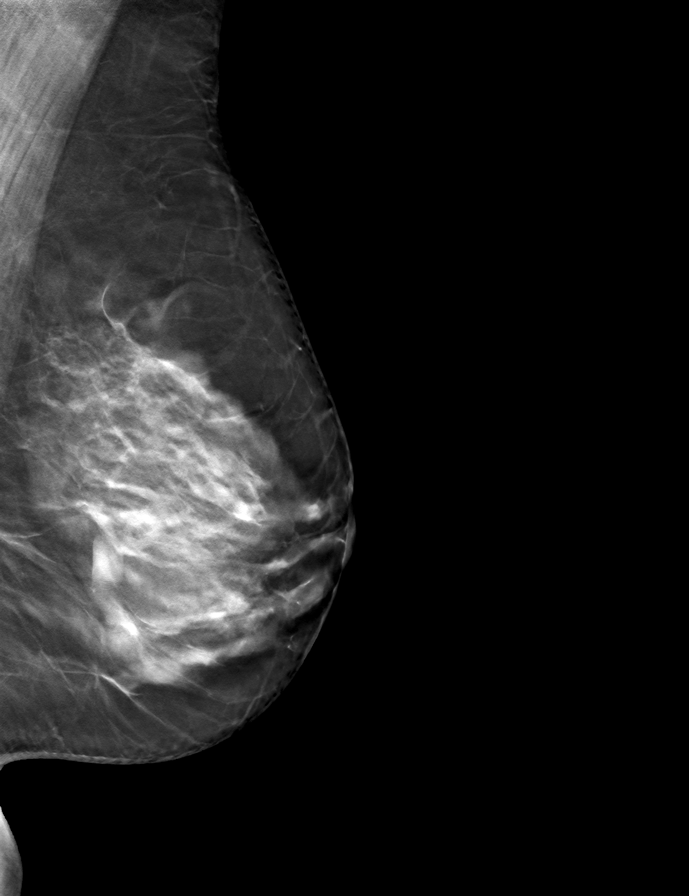

[9 of 24 positions shown; findings below may reference images not displayed]

ACR Breast Density Category d: The breast tissue is extremely dense,
which lowers the sensitivity of mammography
FINDINGS: There are no findings suspicious for malignancy.
IMPRESSION: No mammographic evidence of malignancy. A result letter of this
screening mammogram will be mailed directly to the patient.

RECOMMENDATION:
Screening mammogram in one year. (Code:TA-V-WV9)

BI-RADS CATEGORY  1: Negative.

## 2022-10-22 IMAGING — CR DG CHEST 2V
2 series · 2 of 2 positions shown · non-contrast
Comparison: 02/04/2016

CLINICAL DATA: Chest discomfort for 3 weeks.

EXAM:
CHEST - 2 VIEW

[w chest pa]
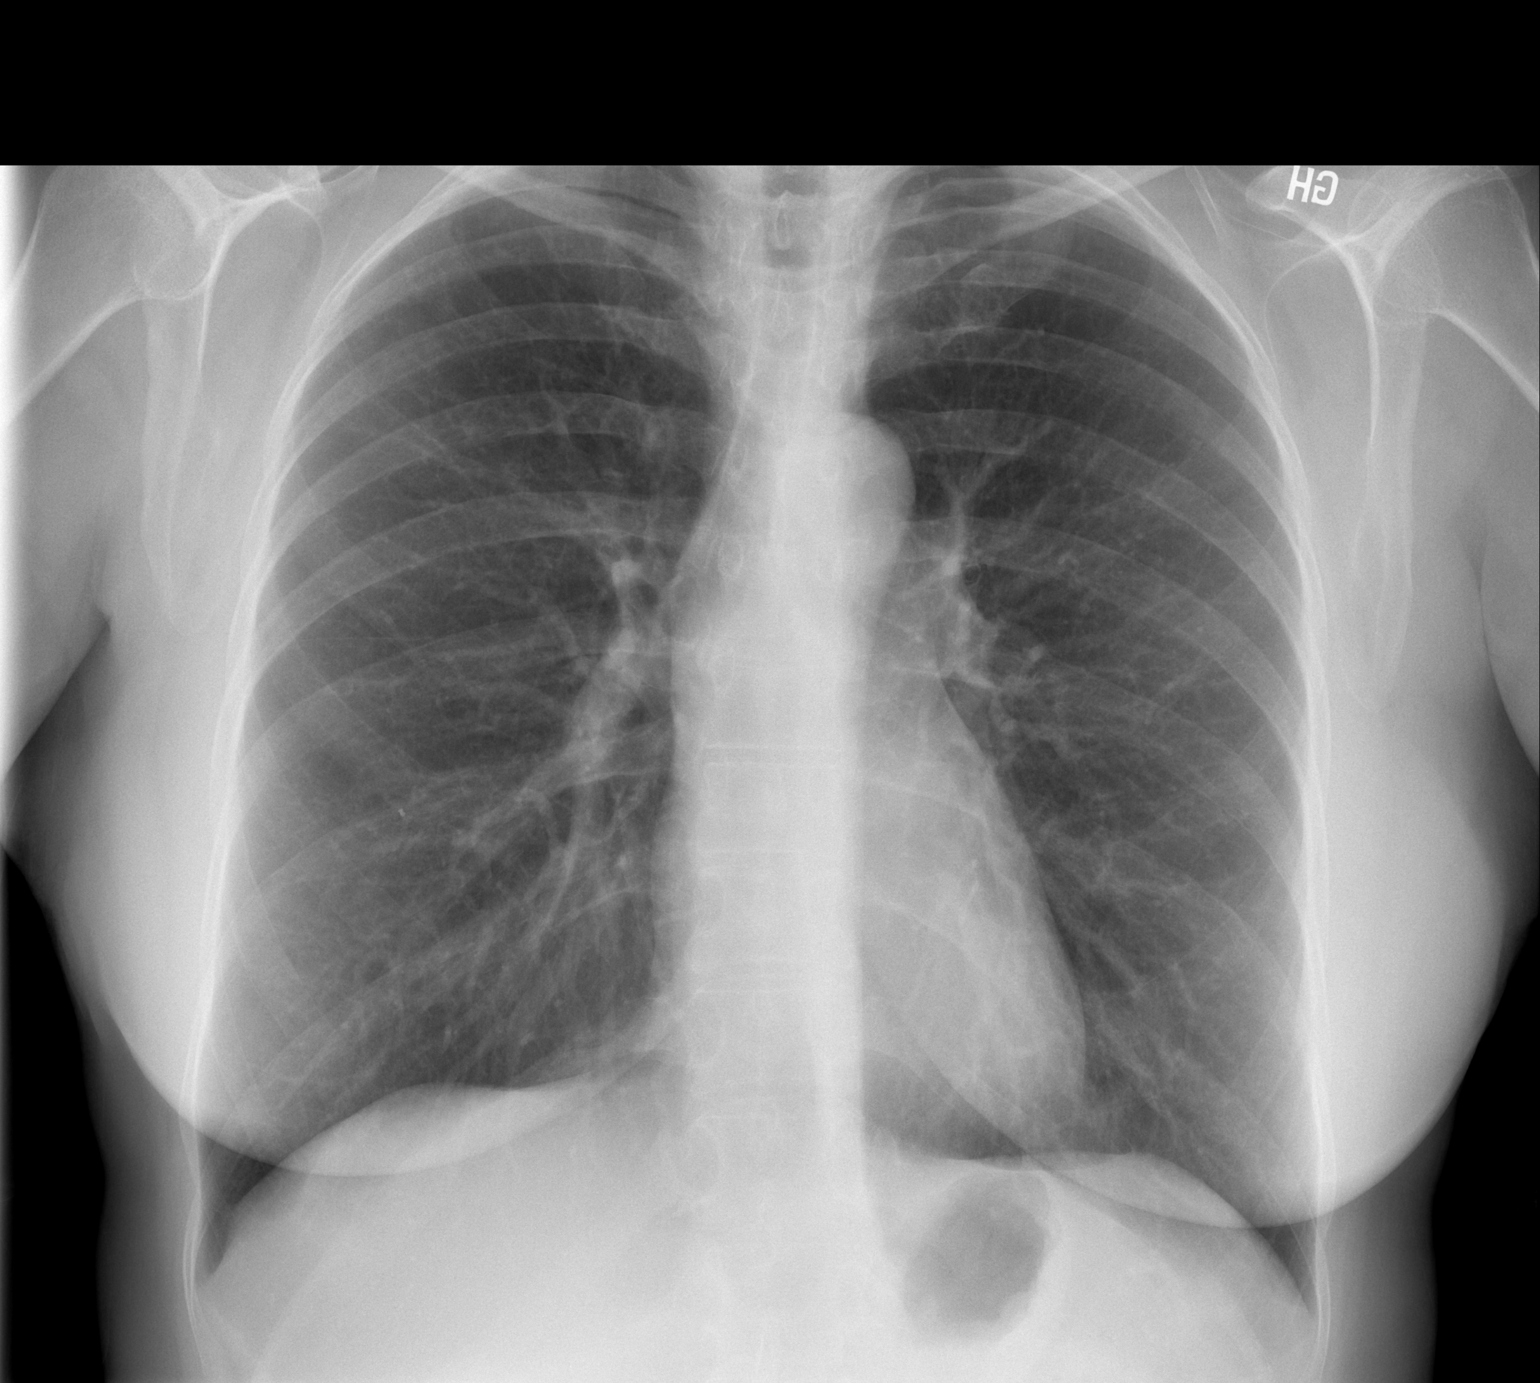

[w chest lat]
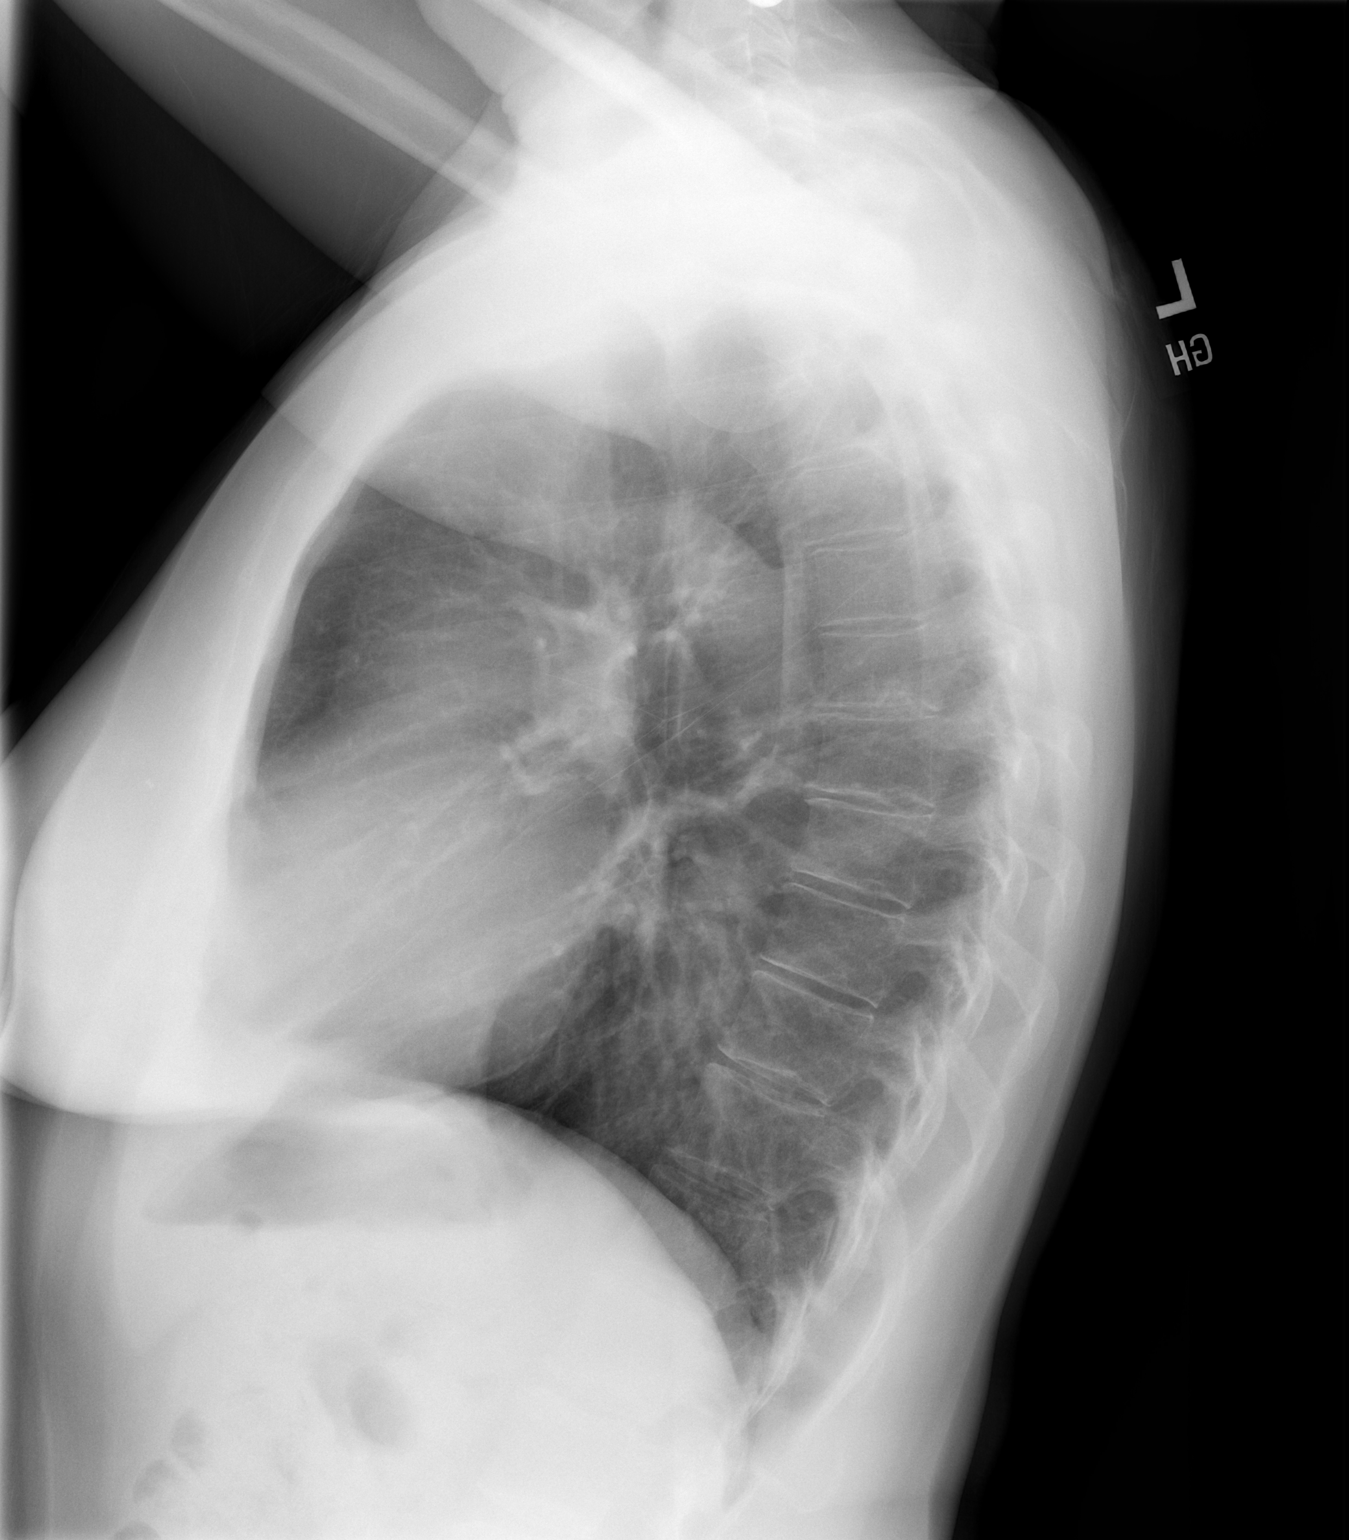

[2 of 2 positions shown; findings below may reference images not displayed]

FINDINGS: The heart size and mediastinal contours are within normal limits.
Both lungs are clear. The visualized skeletal structures are
unremarkable.
IMPRESSION: No active cardiopulmonary disease.

## 2022-11-21 ENCOUNTER — Other Ambulatory Visit: Payer: Self-pay | Admitting: Family Medicine

## 2022-11-21 DIAGNOSIS — Z1231 Encounter for screening mammogram for malignant neoplasm of breast: Secondary | ICD-10-CM

## 2022-12-05 ENCOUNTER — Ambulatory Visit
Admission: RE | Admit: 2022-12-05 | Discharge: 2022-12-05 | Disposition: A | Discharge status: Home / Self Care | Payer: BC Managed Care – PPO | Source: Ambulatory Visit | Attending: Family Medicine | Admitting: Family Medicine

## 2022-12-05 DIAGNOSIS — Z1231 Encounter for screening mammogram for malignant neoplasm of breast: Secondary | ICD-10-CM

## 2024-03-21 ENCOUNTER — Other Ambulatory Visit: Payer: Self-pay | Admitting: Family Medicine

## 2024-03-21 DIAGNOSIS — Z1231 Encounter for screening mammogram for malignant neoplasm of breast: Secondary | ICD-10-CM

## 2024-04-12 ENCOUNTER — Inpatient Hospital Stay
Admission: RE | Admit: 2024-04-12 | Discharge: 2024-04-12 | Payer: Self-pay | Attending: Family Medicine | Admitting: Family Medicine

## 2024-04-12 DIAGNOSIS — Z1231 Encounter for screening mammogram for malignant neoplasm of breast: Secondary | ICD-10-CM
